# Patient Record
Sex: Female | Born: 1960 | Race: White | Hispanic: No
Health system: Southern US, Community
[De-identification: ages and names within clinical notes are randomized; demographics above are authoritative.]

---

## 1998-02-22 ENCOUNTER — Other Ambulatory Visit: Admission: RE | Admit: 1998-02-22 | Discharge: 1998-02-22 | Payer: Self-pay | Admitting: Gynecology

## 2001-05-11 DIAGNOSIS — I10 Essential (primary) hypertension: Secondary | ICD-10-CM | POA: Insufficient documentation

## 2001-05-11 DIAGNOSIS — Z981 Arthrodesis status: Secondary | ICD-10-CM | POA: Insufficient documentation

## 2001-05-11 DIAGNOSIS — M419 Scoliosis, unspecified: Secondary | ICD-10-CM | POA: Insufficient documentation

## 2001-08-18 ENCOUNTER — Other Ambulatory Visit: Admission: RE | Admit: 2001-08-18 | Discharge: 2001-08-18 | Payer: Self-pay | Admitting: Gynecology

## 2004-01-12 DIAGNOSIS — L700 Acne vulgaris: Secondary | ICD-10-CM | POA: Insufficient documentation

## 2008-04-28 DIAGNOSIS — M545 Low back pain, unspecified: Secondary | ICD-10-CM | POA: Insufficient documentation

## 2009-03-23 DIAGNOSIS — F418 Other specified anxiety disorders: Secondary | ICD-10-CM | POA: Insufficient documentation

## 2020-01-10 ENCOUNTER — Ambulatory Visit: Payer: 59 | Admitting: Podiatry

## 2020-01-20 ENCOUNTER — Ambulatory Visit (INDEPENDENT_AMBULATORY_CARE_PROVIDER_SITE_OTHER): Payer: 59 | Admitting: Podiatry

## 2020-01-20 ENCOUNTER — Other Ambulatory Visit: Payer: Self-pay

## 2020-01-20 ENCOUNTER — Ambulatory Visit (INDEPENDENT_AMBULATORY_CARE_PROVIDER_SITE_OTHER): Payer: 59

## 2020-01-20 DIAGNOSIS — S93401A Sprain of unspecified ligament of right ankle, initial encounter: Secondary | ICD-10-CM

## 2020-01-20 DIAGNOSIS — M19079 Primary osteoarthritis, unspecified ankle and foot: Secondary | ICD-10-CM | POA: Diagnosis not present

## 2020-01-20 DIAGNOSIS — T148XXA Other injury of unspecified body region, initial encounter: Secondary | ICD-10-CM

## 2020-01-20 DIAGNOSIS — Q663 Other congenital varus deformities of feet, unspecified foot: Secondary | ICD-10-CM

## 2020-01-25 NOTE — Progress Notes (Signed)
Subjective:   Patient ID: Jaclyn Palmer, female   DOB: 59 y.o.   MRN: [id number]   HPI 59 year old female presents the office with concerns of lateral right ankle pain after sustaining a sprain 3 months ago.  She states that she rolled her ankle and has been tender to walk on.  She is been walking outside of her foot since the injury and she has noticed her foot turning out more.  She said no recent treatment for this.   Review of Systems  All other systems reviewed and are negative.  No past medical history on file.    Current Outpatient Medications:    amLODipine (NORVASC) 5 MG tablet, Take 5 mg by mouth daily., Disp: , Rfl:    ARIPiprazole (ABILIFY) 2 MG tablet, Take 2 mg by mouth daily., Disp: , Rfl:    buPROPion (WELLBUTRIN XL) 300 MG 24 hr tablet, Take 300 mg by mouth daily., Disp: , Rfl:    chlordiazePOXIDE (LIBRIUM) 25 MG capsule, TAKE 1 CAPSULE BY MOUTH DAILY AS NEEDED FOR PANIC ATTACKS, Disp: , Rfl:    diclofenac (VOLTAREN) 75 MG EC tablet, Take 75 mg by mouth 2 (two) times daily., Disp: , Rfl:    estradiol (CLIMARA - DOSED IN MG/24 HR) 0.075 mg/24hr patch, Place onto the skin., Disp: , Rfl:    ibuprofen (ADVIL) 600 MG tablet, Take by mouth., Disp: , Rfl:    losartan-hydrochlorothiazide (HYZAAR) 100-25 MG tablet, Take 1 tablet by mouth daily., Disp: , Rfl:    naproxen (NAPROSYN) 375 MG tablet, SMARTSIG:1 Tablet(s) By Mouth Every 12 Hours PRN, Disp: , Rfl:    phentermine (ADIPEX-P) 37.5 MG tablet, Take 37.5 mg by mouth daily., Disp: , Rfl:    predniSONE (DELTASONE) 50 MG tablet, Take 50 mg by mouth daily., Disp: , Rfl:    progesterone (PROMETRIUM) 200 MG capsule, Take by mouth., Disp: , Rfl:    sertraline (ZOLOFT) 50 MG tablet, Take 50 mg by mouth at bedtime., Disp: , Rfl:   No Known Allergies      Objective:  Physical Exam  General: AAO x3, NAD  Dermatological: Skin is warm, dry and supple bilateral.  There are no open sores, no preulcerative lesions, no  rash or signs of infection present.  Vascular: Dorsalis Pedis artery and Posterior Tibial artery pedal pulses are 2/4 bilateral with immedate capillary fill time. There is no pain with calf compression, swelling, warmth, erythema.   Neruologic: Grossly intact via light touch bilateral.   Musculoskeletal: There is tenderness along the lateral ankle complex and there is instability noted with increased anterior drawer.  Upon weightbearing evaluation she is supinated walking lateral aspect of the foot.  Edema is noted to the lateral ankle complex.  There is no area of pinpoint tenderness.  No pain with ankle joint range of motion.  Muscular strength 5/5 in all groups tested bilateral.  Gait: Unassisted, Nonantalgic.       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  No evidence of acute fracture.  There is calcifications along the lateral foot and ankle likely on the peroneal tendon. -At this point given the instability as is her gait changes I recommend MRI to evaluate peroneal tendon tear as well as lateral ankle ligament complex tear.  This is  for surgical planning.  She is likely going to need to have surgical intervention to help prevent her from walking lateral ankle.  She is likely getting need to have lateral ankle ligament repair possible calcaneal osteotomy.  This is pending MRI however.  For now a Tri-Lock ankle brace was dispensed.  Vivi Barrack DPM

## 2020-02-20 ENCOUNTER — Other Ambulatory Visit: Payer: Self-pay

## 2020-02-21 ENCOUNTER — Other Ambulatory Visit: Payer: Self-pay | Admitting: Podiatry

## 2020-07-08 ENCOUNTER — Ambulatory Visit
Admission: RE | Admit: 2020-07-08 | Discharge: 2020-07-08 | Disposition: A | Discharge status: Home / Self Care | Payer: 59 | Source: Ambulatory Visit | Attending: Podiatry | Admitting: Podiatry

## 2020-07-08 ENCOUNTER — Other Ambulatory Visit: Payer: Self-pay

## 2020-07-08 DIAGNOSIS — T148XXA Other injury of unspecified body region, initial encounter: Secondary | ICD-10-CM

## 2020-08-14 ENCOUNTER — Ambulatory Visit (INDEPENDENT_AMBULATORY_CARE_PROVIDER_SITE_OTHER): Payer: 59 | Admitting: Podiatry

## 2020-08-14 ENCOUNTER — Other Ambulatory Visit: Payer: Self-pay | Admitting: Podiatry

## 2020-08-14 ENCOUNTER — Ambulatory Visit (INDEPENDENT_AMBULATORY_CARE_PROVIDER_SITE_OTHER): Payer: 59

## 2020-08-14 ENCOUNTER — Other Ambulatory Visit: Payer: Self-pay

## 2020-08-14 ENCOUNTER — Encounter: Payer: Self-pay | Admitting: Podiatry

## 2020-08-14 DIAGNOSIS — S93401A Sprain of unspecified ligament of right ankle, initial encounter: Secondary | ICD-10-CM | POA: Diagnosis not present

## 2020-08-14 DIAGNOSIS — S92301A Fracture of unspecified metatarsal bone(s), right foot, initial encounter for closed fracture: Secondary | ICD-10-CM | POA: Diagnosis not present

## 2020-08-14 DIAGNOSIS — S96911D Strain of unspecified muscle and tendon at ankle and foot level, right foot, subsequent encounter: Secondary | ICD-10-CM

## 2020-08-14 DIAGNOSIS — Q663 Other congenital varus deformities of feet, unspecified foot: Secondary | ICD-10-CM | POA: Diagnosis not present

## 2020-08-17 NOTE — Progress Notes (Signed)
Subjective: 60 year old female presents the office today for follow-up evaluation of her right foot and ankle discomfort.  She states that she has been in the cam boot.  She states the foot swells but she is not having any significant discomfort of the foot.  The boot itself causes discomfort otherwise no significant pain she reports that her foot or ankle. Denies any systemic complaints such as fevers, chills, nausea, vomiting. No acute changes since last appointment, and no other complaints at this time.   Objective: AAO x3, NAD DP/PT pulses palpable bilaterally, CRT less than 3 seconds On today's exam unable to elicit any area of pinpoint tenderness.  Particular there is no pain at the plantar surface there is no pain on the peroneal tendons.  No pain in the fibula.  No significant discomfort on the posterior tibial tendon, Achilles tendon.  Achilles tendon appears to be intact.  Strength appears to be adequate.  No pain with inversion, eversion. Varus foot type.  No pain with calf compression, swelling, warmth, erythema  Assessment: Significant partial tearing peroneal tendons, stress fracture fifth metatarsal  Plan: -All treatment options discussed with the patient including all alternatives, risks, complications.  -Repeat x-rays did confirm that there is an avulsion fracture noted off the fifth metatarsal base.  Given this I am going continue mobilization a cam boot.  She not having any significant discomfort.  We discussed surgical intervention but she has an upcoming trip that she still wants to go on with her to hold off on any surgery for now.  However discussed with her repair of the peroneal tendons. -Patient encouraged to call the office with any questions, concerns, change in symptoms.    RTC before she leaves for her trip in 2 weeks for repeat foot x-rays.  Continue cam boot for now.  Vivi Barrack DPM

## 2020-08-29 ENCOUNTER — Ambulatory Visit (INDEPENDENT_AMBULATORY_CARE_PROVIDER_SITE_OTHER): Payer: 59 | Admitting: Podiatry

## 2020-08-29 ENCOUNTER — Other Ambulatory Visit: Payer: Self-pay

## 2020-08-29 ENCOUNTER — Ambulatory Visit (INDEPENDENT_AMBULATORY_CARE_PROVIDER_SITE_OTHER): Payer: 59

## 2020-08-29 DIAGNOSIS — S96911D Strain of unspecified muscle and tendon at ankle and foot level, right foot, subsequent encounter: Secondary | ICD-10-CM | POA: Diagnosis not present

## 2020-08-29 DIAGNOSIS — S92301D Fracture of unspecified metatarsal bone(s), right foot, subsequent encounter for fracture with routine healing: Secondary | ICD-10-CM

## 2020-08-29 DIAGNOSIS — M25371 Other instability, right ankle: Secondary | ICD-10-CM | POA: Diagnosis not present

## 2020-08-29 DIAGNOSIS — Q663 Other congenital varus deformities of feet, unspecified foot: Secondary | ICD-10-CM | POA: Diagnosis not present

## 2020-08-31 NOTE — Progress Notes (Signed)
Subjective: 60 year old female presents the office today for follow-up evaluation of her right foot and ankle discomfort.  She states that she has had any pain to her foot and ankle.  She denies any pain at last appointment but given x-ray findings I placed into a cam boot.  She states that she is doing the same and not having any pain.  She has not the boot some at home quite a [employer] and states that her feet feel fine.  She has no recent injuries or changes since I last saw her and she has no other concerns.    Objective: AAO x3, NAD DP/PT pulses palpable bilaterally, CRT less than 3 seconds On today's exam unable to elicit any area of pinpoint tenderness.  There is noted on course the peroneal tendon, posterior tibial tendon or any flexor, extensor tendons.  Achilles tendon appears intact.  No edema present.  Her main concern is that when she tries to walk her ankle will roll outwards.  Varus foot type.  No pain with calf compression, swelling, warmth, erythema  Assessment: Significant partial tearing peroneal tendons, fifth metatarsal avulsion fracture, cavus foot type  Plan: -All treatment options discussed with the patient including all alternatives, risks, complications.  -Repeat x-rays reveal arthritic changes present the midfoot.  Avulsion fracture of the fifth metatarsal base .  -At this point she not having any pain.  Any concerns for rolling ankle.  Advised her to physical therapy.  This is for ankle stability to help strengthen the peroneal tendons, lateral ankle ligaments.  On MRI she does have significant tear of the peroneal tendons patient having any pain.  Discussed with her that if physical therapy not helpful surgical intervention may be needed.  Discussed shoes with arch supports.  Vivi Barrack DPM

## 2020-10-17 ENCOUNTER — Ambulatory Visit: Payer: 59 | Admitting: Podiatry

## 2021-12-10 IMAGING — MR MR ANKLE*R* W/O CM
5 series · 40 of 40 positions shown · non-contrast
Comparison: X-ray 01/20/2020

CLINICAL DATA: Right ankle pain after injury 10/01/2019

EXAM:
MRI OF THE RIGHT ANKLE WITHOUT CONTRAST
TECHNIQUE: Multiplanar, multisequence MR imaging of the ankle was performed. No
intravenous contrast was administered.

[Series 3: T2 fat-sat · axial · 3.0mm · 0.50mm/px · z∈[-95,+44]mm · 8 of 36 slices shown (1 of 2)]
[im 1/36]
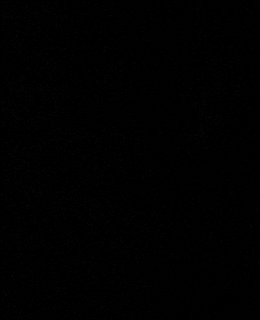
[im 6/36]
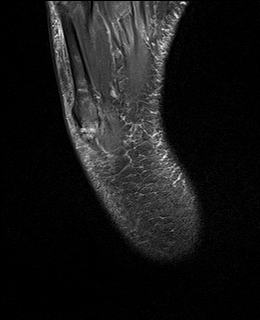
[im 11/36]
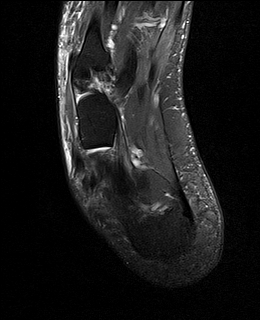
[im 16/36]
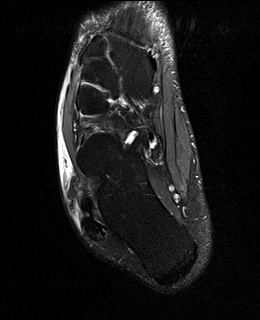
[im 21/36]
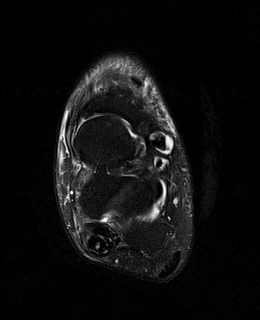
[im 26/36]
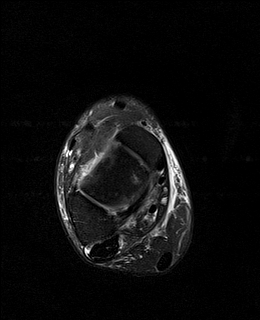
[im 31/36]
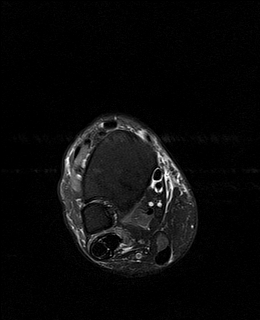
[im 36/36]
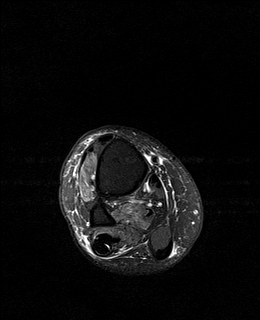

[Series 4: PD fat-sat · axial · 3.0mm · 0.47mm/px · z∈[-96,+43]mm · 9 of 36 slices shown]
[im 1/36]
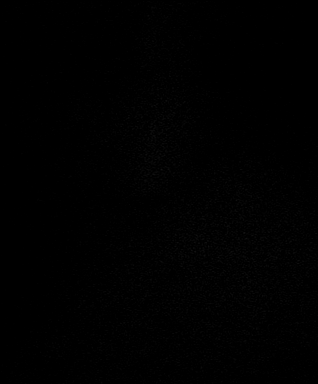
[im 5/36]
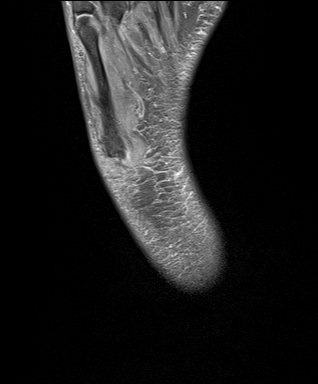
[im 9/36]
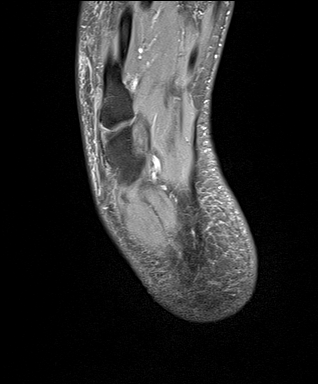
[im 14/36]
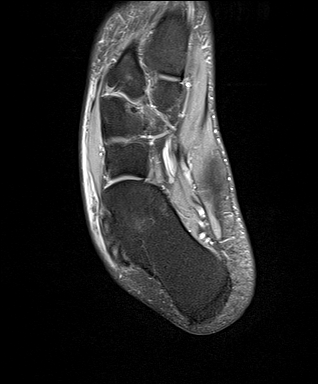
[im 18/36]
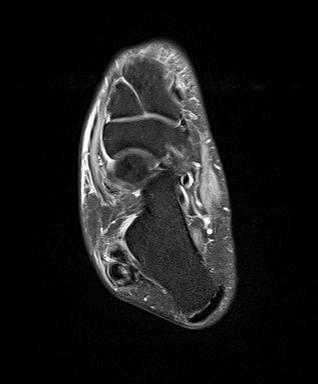
[im 22/36]
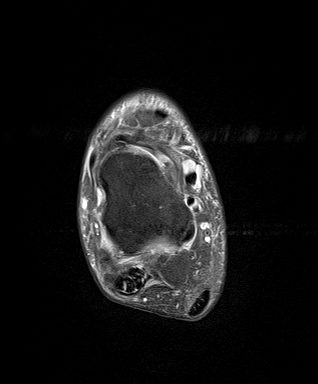
[im 27/36]
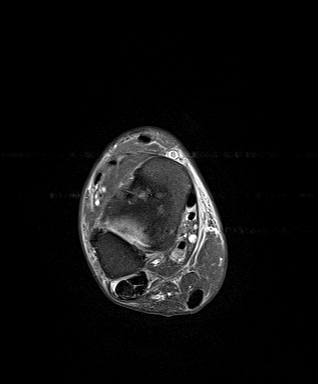
[im 31/36]
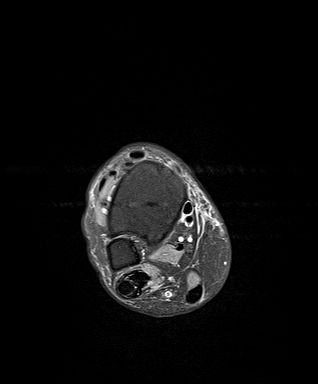
[im 36/36]
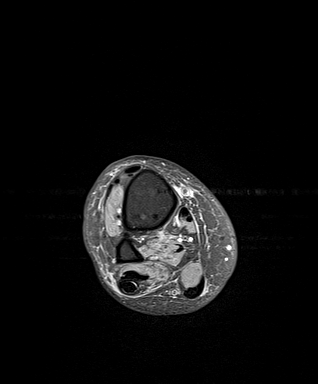

[Series 5: T2 fat-sat · oblique · 3.0mm · 0.62mm/px · 9 of 39 slices shown (2 of 2)]
[im 1/39]
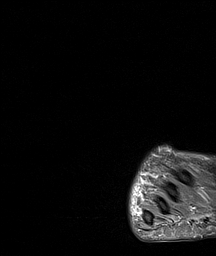
[im 5/39]
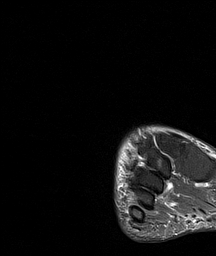
[im 10/39]
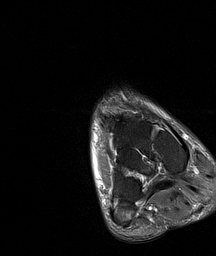
[im 15/39]
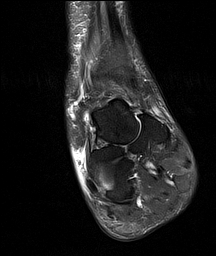
[im 20/39]
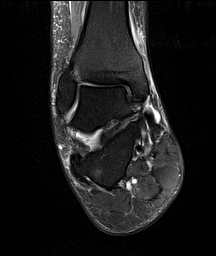
[im 24/39]
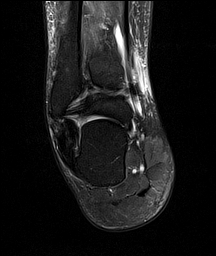
[im 29/39]
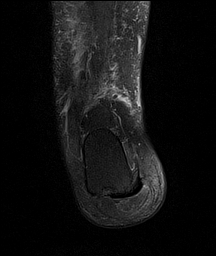
[im 34/39]
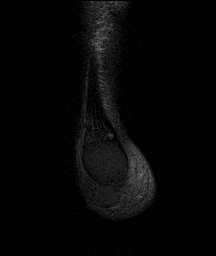
[im 39/39]
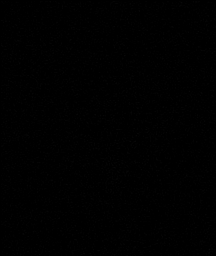

[Series 6: T1 · oblique · 4.0mm · 0.56mm/px · 7 of 27 slices shown]
[im 1/27]
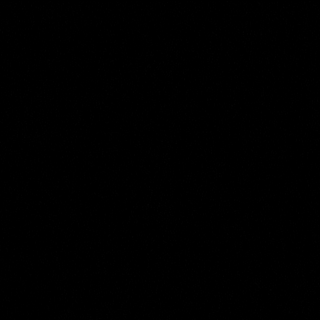
[im 5/27]
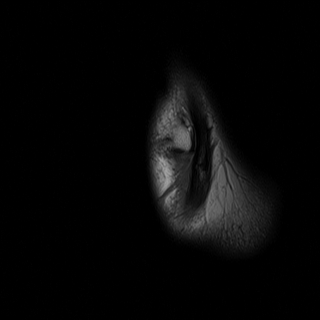
[im 9/27]
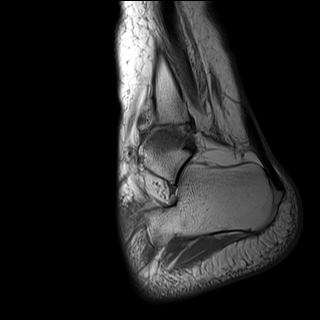
[im 14/27]
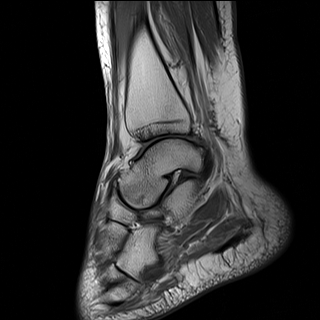
[im 18/27]
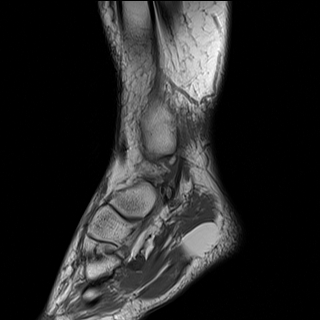
[im 22/27]
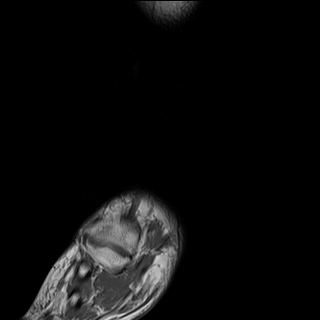
[im 27/27]
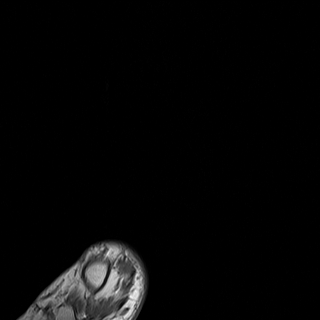

[Series 7: STIR · oblique · 4.0mm · 0.70mm/px · 7 of 27 slices shown]
[im 1/27]
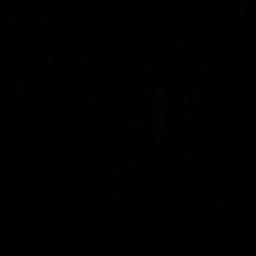
[im 5/27]
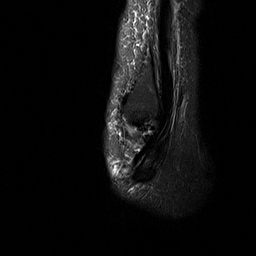
[im 9/27]
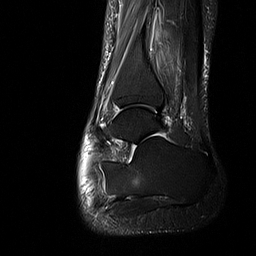
[im 14/27]
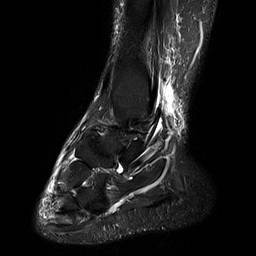
[im 18/27]
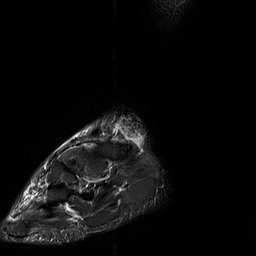
[im 22/27]
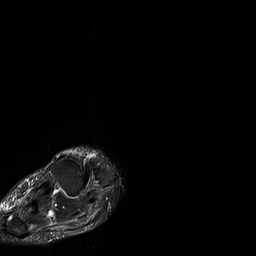
[im 27/27]
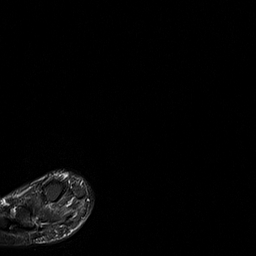

[40 of 40 positions shown; findings below may reference images not displayed]

FINDINGS: TENDONS

Peroneal: Severe tendinosis with extensive longitudinal split tears
involving the peroneus longus and peroneus brevis tendons with mild
associated tenosynovial fluid. No complete tear.

Posteromedial: Intact posterior tibialis tendon with mild tendinosis
distally. Flexor hallucis longus and flexor digitorum longus tendons
are intact. Mild volume tenosynovial fluid associated with each of
the posteromedial ankle tendons.

Anterior: Intact tibialis anterior, extensor hallucis longus and
extensor digitorum longus tendons.

Achilles: Intact.

Plantar Fascia: Intact.

LIGAMENTS

Lateral: Anterior talofibular ligament appears chronically torn.
Posterior talofibular and calcaneofibular ligaments appear intact.
Intact tibiofibular ligaments.

Medial: Deltoid ligament and spring ligament complex intact.

CARTILAGE

Ankle Joint: Multifocal chondral defects involving the tibial
plafond and and talar dome with mild subchondral marrow signal
changes. No osteochondral defect. No joint effusion.

Subtalar Joints/Sinus Tarsi: No joint effusion or chondral defect.
Preservation of the anatomic fat within the sinus tarsi.

Bones: Pes cavovarus alignment. Bone marrow edema within the fifth
metatarsal base without a well-defined fracture line. Remaining
osseous structures are intact. No dislocation.

Other: Nonspecific subcutaneous edema. No organized fluid
collection.
IMPRESSION: 1. Severe tendinosis with extensive longitudinal split tears
involving the peroneus longus and peroneus brevis tendons.
2. Bone marrow edema within the fifth metatarsal base without a
well-defined fracture line. Findings suggestive of stress related
changes. Follow-up x-rays recommended to assess for fracture.
3. Pes cavovarus alignment.
4. Mild posterior tibialis tendinosis. Mild tenosynovitis of the
posteromedial ankle tendons.
5. Multifocal chondral defects involving the tibial plafond and
talar dome with mild subchondral marrow signal changes.
